# Patient Record
Sex: Male | Born: 1970
Health system: Southern US, Community
[De-identification: ages and names within clinical notes are randomized; demographics above are authoritative.]

## PROBLEM LIST (undated history)

## (undated) DIAGNOSIS — N200 Calculus of kidney: Secondary | ICD-10-CM

## (undated) DIAGNOSIS — G8929 Other chronic pain: Secondary | ICD-10-CM

## (undated) HISTORY — PX: APPENDECTOMY: SHX54

---

## 2005-07-30 ENCOUNTER — Ambulatory Visit: Payer: Self-pay | Admitting: General Surgery

## 2006-10-27 ENCOUNTER — Ambulatory Visit: Payer: Self-pay | Admitting: Family Medicine

## 2007-02-23 ENCOUNTER — Emergency Department: Payer: Self-pay | Admitting: Emergency Medicine

## 2007-03-02 ENCOUNTER — Ambulatory Visit: Payer: Self-pay | Admitting: Urology

## 2008-04-15 ENCOUNTER — Ambulatory Visit: Payer: Self-pay | Admitting: Urology

## 2008-05-21 ENCOUNTER — Ambulatory Visit: Payer: Self-pay | Admitting: Urology

## 2008-10-14 ENCOUNTER — Ambulatory Visit: Payer: Self-pay | Admitting: Urology

## 2008-11-08 ENCOUNTER — Ambulatory Visit: Payer: Self-pay | Admitting: Urology

## 2009-01-08 ENCOUNTER — Ambulatory Visit: Payer: Self-pay | Admitting: Urology

## 2009-01-23 ENCOUNTER — Ambulatory Visit: Payer: Self-pay | Admitting: Urology

## 2011-02-11 ENCOUNTER — Ambulatory Visit: Payer: Self-pay | Admitting: Urology

## 2013-01-14 ENCOUNTER — Emergency Department: Payer: Self-pay | Admitting: Emergency Medicine

## 2013-01-14 LAB — CBC
RBC: 4.95 10*6/uL (ref 4.40–5.90)
RDW: 12.7 % (ref 11.5–14.5)
WBC: 10.2 10*3/uL (ref 3.8–10.6)

## 2013-01-14 LAB — URINALYSIS, COMPLETE
Bacteria: NONE SEEN
Bilirubin,UR: NEGATIVE
Glucose,UR: NEGATIVE mg/dL (ref 0–75)
Ketone: NEGATIVE
Nitrite: NEGATIVE
Ph: 8 (ref 4.5–8.0)
RBC,UR: 9 /HPF (ref 0–5)
Squamous Epithelial: <1

## 2013-01-14 LAB — BASIC METABOLIC PANEL
Anion Gap: 9 (ref 7–16)
Osmolality: 276 (ref 275–301)

## 2013-03-10 ENCOUNTER — Ambulatory Visit: Payer: Self-pay | Admitting: Physical Medicine and Rehabilitation

## 2013-08-07 ENCOUNTER — Ambulatory Visit: Payer: Self-pay | Admitting: Family Medicine

## 2013-08-07 LAB — RAPID INFLUENZA A&B ANTIGENS

## 2013-10-11 DIAGNOSIS — N200 Calculus of kidney: Secondary | ICD-10-CM

## 2013-10-11 HISTORY — DX: Calculus of kidney: N20.0

## 2013-10-11 HISTORY — PX: BACK SURGERY: SHX140

## 2013-12-27 ENCOUNTER — Ambulatory Visit: Payer: Self-pay | Admitting: Physical Medicine and Rehabilitation

## 2014-12-21 ENCOUNTER — Ambulatory Visit: Payer: Self-pay | Admitting: Neurosurgery

## 2015-02-15 ENCOUNTER — Emergency Department: Payer: Managed Care, Other (non HMO)

## 2015-02-15 ENCOUNTER — Encounter: Payer: Self-pay | Admitting: Emergency Medicine

## 2015-02-15 ENCOUNTER — Emergency Department
Admission: EM | Admit: 2015-02-15 | Discharge: 2015-02-15 | Disposition: A | Discharge status: Home / Self Care | Payer: Managed Care, Other (non HMO) | Attending: Emergency Medicine | Admitting: Emergency Medicine

## 2015-02-15 DIAGNOSIS — R109 Unspecified abdominal pain: Secondary | ICD-10-CM | POA: Diagnosis present

## 2015-02-15 DIAGNOSIS — N23 Unspecified renal colic: Secondary | ICD-10-CM | POA: Insufficient documentation

## 2015-02-15 HISTORY — DX: Calculus of kidney: N20.0

## 2015-02-15 LAB — URINALYSIS COMPLETE WITH MICROSCOPIC (ARMC ONLY)
Bilirubin Urine: NEGATIVE
Glucose, UA: NEGATIVE mg/dL
Ketones, ur: NEGATIVE mg/dL
LEUKOCYTES UA: NEGATIVE
Nitrite: NEGATIVE
PH: 7 (ref 5.0–8.0)
PROTEIN: NEGATIVE mg/dL
Specific Gravity, Urine: 1.02 (ref 1.005–1.030)
Squamous Epithelial / LPF: NONE SEEN

## 2015-02-15 LAB — CBC
HCT: 46 % (ref 40.0–52.0)
Hemoglobin: 15.8 g/dL (ref 13.0–18.0)
MCH: 29.6 pg (ref 26.0–34.0)
MCHC: 34.4 g/dL (ref 32.0–36.0)
MCV: 86 fL (ref 80.0–100.0)
Platelets: 336 10*3/uL (ref 150–440)
RBC: 5.34 MIL/uL (ref 4.40–5.90)
RDW: 12.6 % (ref 11.5–14.5)
WBC: 9.1 10*3/uL (ref 3.8–10.6)

## 2015-02-15 LAB — BASIC METABOLIC PANEL
Anion gap: 11 (ref 5–15)
BUN: 13 mg/dL (ref 6–20)
CO2: 24 mmol/L (ref 22–32)
Calcium: 9.6 mg/dL (ref 8.9–10.3)
Chloride: 102 mmol/L (ref 101–111)
Creatinine, Ser: 1.11 mg/dL (ref 0.61–1.24)
Glucose, Bld: 109 mg/dL — ABNORMAL HIGH (ref 65–99)
POTASSIUM: 3.3 mmol/L — AB (ref 3.5–5.1)
SODIUM: 137 mmol/L (ref 135–145)

## 2015-02-15 MED ORDER — TAMSULOSIN HCL 0.4 MG PO CAPS: 0.4000 mg | ORAL_CAPSULE | Freq: Every day | ORAL | Status: DC

## 2015-02-15 MED ORDER — OXYCODONE-ACETAMINOPHEN 5-325 MG PO TABS
ORAL_TABLET | ORAL | Status: AC
  Administered 2015-02-15: 1 via ORAL
  Filled 2015-02-15: qty 1

## 2015-02-15 MED ORDER — OXYCODONE-ACETAMINOPHEN 5-325 MG PO TABS
1.0000 | ORAL_TABLET | Freq: Once | ORAL | Status: AC
  Administered 2015-02-15: 1 via ORAL

## 2015-02-15 NOTE — Discharge Instructions (Signed)
Abdominal Pain Many things can cause abdominal pain. Usually, abdominal pain is not caused by a disease and will improve without treatment. It can often be observed and treated at home. Your health care provider will do a physical exam and possibly order blood tests and X-rays to help determine the seriousness of your pain. However, in many cases, more time must pass before a clear cause of the pain can be found. Before that point, your health care provider may not know if you need more testing or further treatment. HOME CARE INSTRUCTIONS  Monitor your abdominal pain for any changes. The following actions may help to alleviate any discomfort you are experiencing:  Only take over-the-counter or prescription medicines as directed by your health care provider.  Do not take laxatives unless directed to do so by your health care provider.  Try a clear liquid diet (broth, tea, or water) as directed by your health care provider. Slowly move to a bland diet as tolerated. SEEK MEDICAL CARE IF:  You have unexplained abdominal pain.  You have abdominal pain associated with nausea or diarrhea.  You have pain when you urinate or have a bowel movement.  You experience abdominal pain that wakes you in the night.  You have abdominal pain that is worsened or improved by eating food.  You have abdominal pain that is worsened with eating fatty foods.  You have a fever. SEEK IMMEDIATE MEDICAL CARE IF:   Your pain does not go away within 2 hours.  You keep throwing up (vomiting).  Your pain is felt only in portions of the abdomen, such as the right side or the left lower portion of the abdomen.  You pass bloody or black tarry stools. MAKE SURE YOU:  Understand these instructions.   Will watch your condition.   Will get help right away if you are not doing well or get worse.  Document Released: 07/07/2005 Document Revised: 10/02/2013 Document Reviewed: 06/06/2013 [facility] Patient Information  2015 [place]. This information is not intended to replace advice given to you by your health care provider. Make sure you discuss any questions you have with your health care provider. PLEASE RETURN FOR WORSE PAIN, FEVER OR VOMITING.  FOLLOW UP WITH YOUR UROLOGIST DR WOLFF. STRAIN THE URINE, TAKE THE STONE TO DR WOLFF IF YOU CATCH IT.  USE THE PERCOCET 1 - 2 PILLS 4 X A DAY IF NEEDED FOR PAIN. DO NOT TAKE THE PERCOCET AND VICODIN TOGETHER. TAKE THE FLOMAX 1 PILL A DAY TO HELP THE STONE PASS.

## 2015-02-15 NOTE — ED Notes (Signed)
Patient presents to the ED with right flank pain that began at 7:15am while urinating.  Patient reports history of kidney stone about 1 year ago.  Patient states this pain feels similar.  Patient denies vomiting, reports nausea and feeling faint earlier.  Patient states pain was originally a 10 but seems to be easing now.

## 2015-02-15 NOTE — ED Provider Notes (Signed)
[facility] Emergency Department Provider Note    ____________________________________________  Time seen: 72830  I have reviewed the triage vital signs and the nursing notes.   HISTORY  Chief Complaint Flank Pain       HPI Jason Park is a 44 y.o. male she reports he has had right flank pain starting today while having a bowel movement A is like pain he had with prior kidney stone also reports slight tingling at the tip of his penis today pain was in the lower right flank came on this morning is gone now he reports he took a hydrocodone just before getting in the car so come on by the time he got here pain was 10 out of 10 now at 0 thinks he did make it better or worse she had no other associated symptoms she reports also that he had surgery in December and the vent over posture he had to a doctor when he developed this flank pain seems to aggravated his back pain slightly back pain is now about a 3 out of 5 which is his normal level of back pain he denies any nausea vomiting fever chills dysuria urgency frequency cough or anything else     Past Medical History  Diagnosis Date  . Kidney stone 2015    There are no active problems to display for this patient.   Past Surgical History  Procedure Laterality Date  . Appendectomy    . Back surgery  2015    No current outpatient prescriptions on file.  Allergies Bee venom and Penicillins  No family history on file.  Social History History  Substance Use Topics  . Smoking status: Never Smoker   . Smokeless tobacco: Never Used  . Alcohol Use: No    Review of Systems  review of systems is negative for 10 systems except for as described in history of present illness  ____________________________________________   PHYSICAL EXAM:  VITAL SIGNS: ED Triage Vitals  Enc Vitals Group     BP 02/15/15 0805 159/97 mmHg     Pulse Rate 02/15/15 0805 93     Resp 02/15/15 0805 22     Temp 02/15/15  0805 97.6 F (36.4 C)     Temp Source 02/15/15 0805 Oral     SpO2 02/15/15 0805 100 %     Weight 02/15/15 0805 200 lb (90.719 kg)     Height 02/15/15 0805 6\' 2"  (1.88 m)     Head Cir --      Peak Flow --      Pain Score 02/15/15 0806 5     Pain Loc --      Pain Edu? --      Excl. in GC? --      Constitutional: Alert and oriented. Well appearing and in no distress. Eyes: Conjunctivae are normal. PERRL. Normal extraocular movements. ENT   Head: Normocephalic and atraumatic.   Nose: No congestion/rhinnorhea.   Mouth/Throat: Mucous membranes are moist.   Neck: No stridor. Hematological/Lymphatic/Immunilogical: No cervical lymphadenopathy. Cardiovascular: Normal rate, regular rhythm. Normal and symmetric distal pulses are present in all extremities. No murmurs, rubs, or gallops. Respiratory: Normal respiratory effort without tachypnea nor retractions. Breath sounds are clear and equal bilaterally. No wheezes/rales/rhonchi. Gastrointestinal: Soft and nontender. No distention. No abdominal bruits. There is no CVA tenderness. Genitourinary: Deferred as patient's daughters are in the room with him Musculoskeletal: Nontender with normal range of motion in all extremities. No joint effusions.  No lower  extremity tenderness nor edema. Neurologic:  Normal speech and language. No gross focal neurologic deficits are appreciated. Speech is normal. No gait instability. Skin:  Skin is warm, dry and intact. No rash noted. Psychiatric: Mood and affect are normal. Speech and behavior are normal. Patient exhibits appropriate insight and judgment.  ____________________________________________    LABS (pertinent positives/negatives)    ____________________________________________   EKG    ____________________________________________    RADIOLOGY    ____________________________________________   PROCEDURES  Procedure(s) performed: None  Critical Care performed:  No  ____________________________________________   INITIAL IMPRESSION / ASSESSMENT AND PLAN / ED COURSE  Pertinent labs & imaging results that were available during my care of the patient were reviewed by me and considered in my medical decision making (see chart for details).  Patient reports pain is slightly worse than earlier but no where near as bad as it was before he came in he has had 2 previous CT scans discussed with him the number to much radiation the fact that really do not want a CAT scan and began slight tenderness in the right lower quadrant however he has had an appendectomy is not nauseated and vomit or vomiting he does not have a fever his white count is normal I do not really feel a pressing need to CT him at this time he is aware of the need to return for worse pain fever vomiting or any other symptoms I will give him his Flomax and Percocet which is slightly stronger than the hydrocodone that he is taking and he will follow with Dr. Artis FlockWolfe urologist  ____________________________________________   FINAL CLINICAL IMPRESSION(S) / ED DIAGNOSES  Final diagnoses:  Abdominal pain, unspecified abdominal location  Renal colic    Arnaldo NatalPaul F Malinda, MD 02/15/15 1133

## 2015-06-30 ENCOUNTER — Encounter: Payer: Self-pay | Admitting: Physical Therapy

## 2015-06-30 ENCOUNTER — Ambulatory Visit: Payer: Managed Care, Other (non HMO) | Attending: Neurosurgery | Admitting: Physical Therapy

## 2015-06-30 DIAGNOSIS — R269 Unspecified abnormalities of gait and mobility: Secondary | ICD-10-CM | POA: Diagnosis present

## 2015-06-30 DIAGNOSIS — R262 Difficulty in walking, not elsewhere classified: Secondary | ICD-10-CM

## 2015-06-30 NOTE — Therapy (Signed)
[name] [address] [place], [place], [address] Phone: [phone]   Fax:  [phone]  Physical Therapy Treatment  Patient Details  Name: Jason Park MRN: [id number] Date of Birth: 01/01/1971 Referring Provider:  Dorothey Baseman, MD  Encounter Date: 06/30/2015      PT End of Session - 07/01/15 5638    Visit Number 1   Number of Visits 17   Date for PT Re-Evaluation 08/26/15   PT Start Time 0530   PT Stop Time 0630   PT Time Calculation (min) 60 min   Activity Tolerance Patient limited by pain   Behavior During Therapy [facility] for tasks assessed/performed      Past Medical History  Diagnosis Date  . Kidney stone 2015  . Kidney stones     Past Surgical History  Procedure Laterality Date  . Appendectomy    . Back surgery  2015    There were no vitals filed for this visit.  Visit Diagnosis:  Difficulty walking  Abnormality of gait      Subjective Assessment - 06/30/15 1737    Subjective Patient is having back and right leg pain in his lateral thigh and calf.          Eval findings: Strength WFL BLE except RLE hip add -3/5 ROM  WFL Trunk flex/SBR/SBL, extension 15% and painful Sensation: RLE L 3 side of thigh and back of calf Pain in right calf 5/10 and R low back 5 x sit to stand: 15.36 sec Occasional BLE knee giving away 1-2 times in the last 2 months R> L 6 MW 1050 feet Therapeutic exericse: Hooklying with TA contraction and marching, hip abd/ER, heel slides x 20 BLE                       PT Education - 07/01/15 0828    Education provided Yes   Education Details HEP    Person(s) Educated Patient   Methods Explanation   Comprehension Verbalized understanding             PT Long Term Goals - 07/01/15 0837    PT LONG TERM GOAL #1   Title Patient will increase six minute walk test distance to >1800 for progression to community ambulator and improve gait  ability   Status Achieved   PT LONG TERM GOAL #2   Title Patient will reduce timed up and go to <11 seconds to reduce fall risk and demonstrate improved transfer/gait ability.   Status Achieved   PT LONG TERM GOAL #3   Title Patient will be independent in home exercise program to improve strength/mobility for better functional independence with ADLs   Status Achieved   PT LONG TERM GOAL #4   Title Patient will report a worst pain of 3/10 on VAS in             to improve tolerance with ADLs and reduced symptoms with activities   Status Achieved               Plan - 07/01/15 0830    Clinical Impression Statement Patient is 44 year old male with chronic back pain and RLE thigh and calf. He has negative SLR test ,- prone knee flex test, negative spring test lumbar spine and is not tender to palpation paraspinal muscles. He has WNL strength BLE except RLE hip add. He has painful gait with increasing pain to right leg and calf after  200 feet. His 6 MW test is impaired and 5 x sit to stand test is impaired. Trunk ROM is limited in extension to 15 % and painful and  trunk flex, SBright and left is  [facility] and painfree.   Pt will benefit from skilled therapeutic intervention in order to improve on the following deficits Abnormal gait;Decreased range of motion;Difficulty walking;Decreased activity tolerance;Pain;Hypomobility;Impaired sensation   Rehab Potential Fair   PT Frequency 2x / week   PT Duration 8 weeks   PT Treatment/Interventions Therapeutic exercise;Therapeutic activities;Functional mobility training;Gait training;Manual techniques;Ultrasound;Electrical Stimulation;Cryotherapy;Moist Heat;Traction   PT Next Visit Plan Core strengthening   Consulted and Agree with Plan of Care Patient        Problem List There are no active problems to display for this patient.   Ezekiel Ina 07/01/2015, 8:42 AM  [facility] MAIN [facility] SERVICES [address] [place], [place], [address] Phone: [phone]   Fax:  [phone]

## 2015-07-02 ENCOUNTER — Ambulatory Visit: Payer: Managed Care, Other (non HMO) | Admitting: Physical Therapy

## 2015-07-07 ENCOUNTER — Ambulatory Visit: Payer: Managed Care, Other (non HMO) | Admitting: Physical Therapy

## 2015-07-09 ENCOUNTER — Ambulatory Visit: Payer: Managed Care, Other (non HMO) | Admitting: Physical Therapy

## 2015-07-14 ENCOUNTER — Ambulatory Visit: Payer: Managed Care, Other (non HMO) | Attending: Neurosurgery | Admitting: Physical Therapy

## 2015-07-14 DIAGNOSIS — R269 Unspecified abnormalities of gait and mobility: Secondary | ICD-10-CM | POA: Insufficient documentation

## 2015-07-14 DIAGNOSIS — R262 Difficulty in walking, not elsewhere classified: Secondary | ICD-10-CM | POA: Insufficient documentation

## 2015-07-16 ENCOUNTER — Encounter: Payer: Self-pay | Admitting: Physical Therapy

## 2015-07-16 ENCOUNTER — Ambulatory Visit: Payer: Managed Care, Other (non HMO) | Admitting: Physical Therapy

## 2015-07-16 DIAGNOSIS — R269 Unspecified abnormalities of gait and mobility: Secondary | ICD-10-CM | POA: Diagnosis present

## 2015-07-16 DIAGNOSIS — R262 Difficulty in walking, not elsewhere classified: Secondary | ICD-10-CM | POA: Diagnosis present

## 2015-07-16 NOTE — Therapy (Signed)
[place] [facility] MAIN [facility] SERVICES [address] [place], [place], [address] Phone: [phone]   Fax:  [phone]  Physical Therapy Treatment  Patient Details  Name: Jason Park MRN: [id number] Date of Birth: 1971-05-02 Referring Provider:  Shellia Carwin., MD  Encounter Date: 07/16/2015      PT End of Session - 07/16/15 1848    Visit Number 3   Number of Visits 17   Date for PT Re-Evaluation 08/26/15   PT Start Time 0545   PT Stop Time 0655   PT Time Calculation (min) 70 min   Activity Tolerance Patient limited by pain   Behavior During Therapy [facility] for tasks assessed/performed      Past Medical History  Diagnosis Date  . Kidney stone 2015  . Kidney stones     Past Surgical History  Procedure Laterality Date  . Appendectomy    . Back surgery  2015    There were no vitals filed for this visit.  Visit Diagnosis:  Difficulty walking  Abnormality of gait      Subjective Assessment - 07/16/15 1847    Subjective Patient is having back and right leg pain in his lateral thigh and calf.7/10            Manual therapy long axis distraction  BLE x 6 bouts x 3 minutes, family instructed in long axis distraction with wife and daughter Instructed in body mechanics and safe mobility and seating positions.  Lumbar  traction 80  Lbs 10 sec, 120 lbs  20 sec   Prone x 10 minutes and 10 minutes supine positionPatient has 0/10 pain in legs after treatment and 7/10 pain in back.                      PT Education - 07/16/15 1847    Education provided Yes   Education Details HEP for family   Person(s) Educated Patient   Methods Explanation   Comprehension Verbalized understanding             PT Long Term Goals - 07/01/15 0837    PT LONG TERM GOAL #1   Title Patient will increase six minute walk test distance to >1800 for progression to community ambulator and improve gait ability   Status Achieved   PT LONG TERM GOAL #2   Title Patient will reduce timed up and go to <11 seconds to reduce fall risk and demonstrate improved transfer/gait ability.   Status Achieved   PT LONG TERM GOAL #3   Title Patient will be independent in home exercise program to improve strength/mobility for better functional independence with ADLs   Status Achieved   PT LONG TERM GOAL #4   Title Patient will report a worst pain of 3/10 on VAS in             to improve tolerance with ADLs and reduced symptoms with activities   Status Achieved               Problem List There are no active problems to display for this patient.   Ezekiel Ina 07/17/2015, 9:15 AM  [place] MAIN [facility] SERVICES [address] [place], [place], [address] Phone: [phone]   Fax:  [phone]

## 2015-07-21 ENCOUNTER — Encounter: Payer: Self-pay | Admitting: Physical Therapy

## 2015-07-21 ENCOUNTER — Ambulatory Visit: Payer: Managed Care, Other (non HMO) | Admitting: Physical Therapy

## 2015-07-21 DIAGNOSIS — R262 Difficulty in walking, not elsewhere classified: Secondary | ICD-10-CM | POA: Diagnosis not present

## 2015-07-21 DIAGNOSIS — R269 Unspecified abnormalities of gait and mobility: Secondary | ICD-10-CM

## 2015-07-21 NOTE — Therapy (Signed)
[place] [facility] MAIN [facility] SERVICES [address] [place], [place], [address] Phone: [phone]   Fax:  [phone]  Physical Therapy Treatment  Patient Details  Name: Jason Park MRN: [id number] Date of Birth: [birth year] Referring Provider:  Shellia Carwin., MD  Encounter Date: 07/21/2015      PT End of Session - 07/21/15 1758    Visit Number 4   Number of Visits 17   Date for PT Re-Evaluation 08/26/15   PT Start Time 0510   PT Stop Time 0550   PT Time Calculation (min) 40 min   Activity Tolerance Patient limited by pain   Behavior During Therapy [facility] for tasks assessed/performed      Past Medical History  Diagnosis Date  . Kidney stone 2015  . Kidney stones     Past Surgical History  Procedure Laterality Date  . Appendectomy    . Back surgery  2015    There were no vitals filed for this visit.  Visit Diagnosis:  Difficulty walking  Abnormality of gait      Subjective Assessment - 07/21/15 1736    Subjective Patietn is having 3/10 back pain and right foot pain and tingling.   Currently in Pain? Yes   Pain Score 3    Pain Location Foot        Long axis distraction LLE and RLE x 3 bouts 8 minutes each PA glides grade 2 L1-L5 x 30 Prone extension exercises and standing extension exercises with reduction in symptons to 0/10 R foot and then increased to 2/10 after 1000 feet of ambulation Instructed in body mechanics and posture.                          PT Education - 07/21/15 1757    Education provided Yes   Education Details HEP    Person(s) Educated Patient   Methods Explanation   Comprehension Verbalized understanding             PT Long Term Goals - 07/01/15 0837    PT LONG TERM GOAL #1   Title Patient will increase six minute walk test distance to >1800 for progression to community ambulator and improve gait ability   Status Achieved   PT LONG TERM GOAL #2   Title  Patient will reduce timed up and go to <11 seconds to reduce fall risk and demonstrate improved transfer/gait ability.   Status Achieved   PT LONG TERM GOAL #3   Title Patient will be independent in home exercise program to improve strength/mobility for better functional independence with ADLs   Status Achieved   PT LONG TERM GOAL #4   Title Patient will report a worst pain of 3/10 on VAS in             to improve tolerance with ADLs and reduced symptoms with activities   Status Achieved               Plan - 07/21/15 1758    Clinical Impression Statement Patient is having decreased symptoms after manual therapy and prone extension exercises.    Pt will benefit from skilled therapeutic intervention in order to improve on the following deficits Abnormal gait;Decreased range of motion;Difficulty walking;Decreased activity tolerance;Pain;Hypomobility;Impaired sensation   Rehab Potential Fair   PT Frequency 2x / week   PT Duration 8 weeks   PT Treatment/Interventions Therapeutic exercise;Therapeutic activities;Functional mobility training;Gait training;Manual techniques;Ultrasound;Electrical Stimulation;Cryotherapy;Moist Heat;Traction  PT Next Visit Plan Core strengthening   Consulted and Agree with Plan of Care Patient        Problem List There are no active problems to display for this patient.   Ezekiel Ina 07/21/2015, 6:00 PM  [place] [facility] MAIN [facility] SERVICES [address] [place], [place], [address] Phone: [phone]   Fax:  [phone]

## 2015-07-23 ENCOUNTER — Ambulatory Visit: Payer: Managed Care, Other (non HMO) | Admitting: Physical Therapy

## 2015-07-28 ENCOUNTER — Encounter: Payer: Managed Care, Other (non HMO) | Admitting: Physical Therapy

## 2015-07-29 ENCOUNTER — Encounter: Payer: Managed Care, Other (non HMO) | Admitting: Physical Therapy

## 2015-07-30 ENCOUNTER — Encounter: Payer: Managed Care, Other (non HMO) | Admitting: Physical Therapy

## 2015-08-05 ENCOUNTER — Encounter: Payer: Managed Care, Other (non HMO) | Admitting: Physical Therapy

## 2015-08-11 ENCOUNTER — Encounter: Payer: Managed Care, Other (non HMO) | Admitting: Physical Therapy

## 2015-08-13 ENCOUNTER — Encounter: Payer: Managed Care, Other (non HMO) | Admitting: Physical Therapy

## 2015-08-18 ENCOUNTER — Encounter: Payer: Managed Care, Other (non HMO) | Admitting: Physical Therapy

## 2015-08-20 ENCOUNTER — Encounter: Payer: Managed Care, Other (non HMO) | Admitting: Physical Therapy

## 2016-06-17 ENCOUNTER — Other Ambulatory Visit: Payer: Self-pay | Admitting: Physical Medicine & Rehabilitation

## 2016-06-17 DIAGNOSIS — M961 Postlaminectomy syndrome, not elsewhere classified: Secondary | ICD-10-CM

## 2016-06-29 ENCOUNTER — Ambulatory Visit
Admission: RE | Admit: 2016-06-29 | Discharge: 2016-06-29 | Disposition: A | Discharge status: Home / Self Care | Payer: Managed Care, Other (non HMO) | Source: Ambulatory Visit | Attending: Physical Medicine & Rehabilitation | Admitting: Physical Medicine & Rehabilitation

## 2016-06-29 DIAGNOSIS — M961 Postlaminectomy syndrome, not elsewhere classified: Secondary | ICD-10-CM | POA: Insufficient documentation

## 2016-06-29 DIAGNOSIS — M5416 Radiculopathy, lumbar region: Secondary | ICD-10-CM | POA: Diagnosis not present

## 2016-06-29 DIAGNOSIS — M4806 Spinal stenosis, lumbar region: Secondary | ICD-10-CM | POA: Insufficient documentation

## 2016-10-06 ENCOUNTER — Other Ambulatory Visit: Payer: Self-pay | Admitting: Adult Health

## 2016-10-06 DIAGNOSIS — N5089 Other specified disorders of the male genital organs: Secondary | ICD-10-CM

## 2016-10-07 ENCOUNTER — Ambulatory Visit
Admission: RE | Admit: 2016-10-07 | Discharge: 2016-10-07 | Disposition: A | Discharge status: Home / Self Care | Payer: Managed Care, Other (non HMO) | Source: Ambulatory Visit | Attending: Adult Health | Admitting: Adult Health

## 2016-10-07 DIAGNOSIS — N433 Hydrocele, unspecified: Secondary | ICD-10-CM | POA: Diagnosis not present

## 2016-10-07 DIAGNOSIS — N503 Cyst of epididymis: Secondary | ICD-10-CM | POA: Insufficient documentation

## 2016-10-07 DIAGNOSIS — N5089 Other specified disorders of the male genital organs: Secondary | ICD-10-CM | POA: Insufficient documentation

## 2016-10-07 DIAGNOSIS — I861 Scrotal varices: Secondary | ICD-10-CM | POA: Diagnosis not present

## 2017-06-17 IMAGING — CT CT L SPINE W/O CM
3 of 11 series · 9 of 33 positions shown, 11 images · non-contrast
Comparison: Lumbar MRI 12/21/2014 and earlier.

CLINICAL DATA: 44-year-old male with right lower extremity
radiculopathy. Lumbar back pain. Prior lumbar surgery x2. Subsequent
encounter.

EXAM:
CT LUMBAR SPINE WITHOUT CONTRAST
TECHNIQUE: Multidetector CT imaging of the lumbar spine was performed without
intravenous contrast administration. Multiplanar CT image
reconstructions were also generated.

[Series 7: coronal bone · coronal · 0.29mm/px · 1 of 61 slices shown]
[im 31/61  bone]
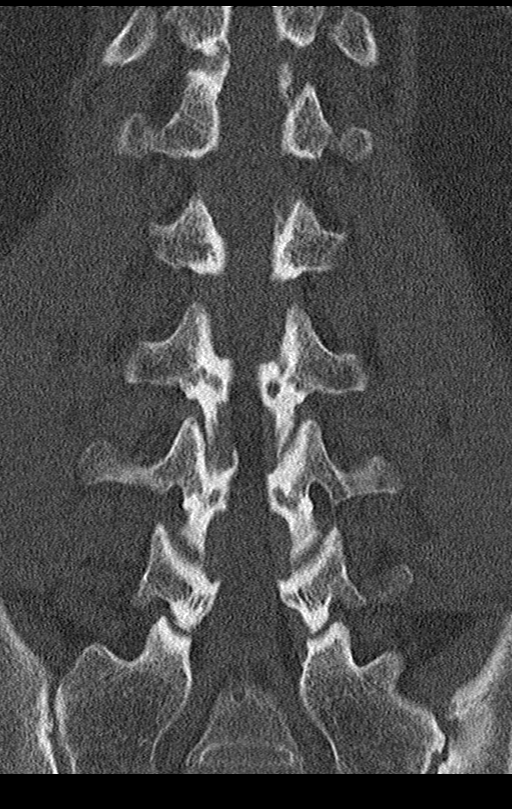

[Series 9: spine · axial · 0.21mm/px · z∈[-257,-135]mm · 3 of 133 slices shown, 4 images]
[im 34/133  soft-tissue]
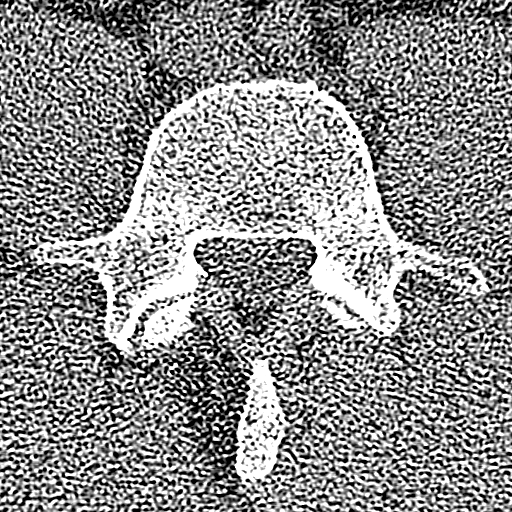
[im 34/133  bone]
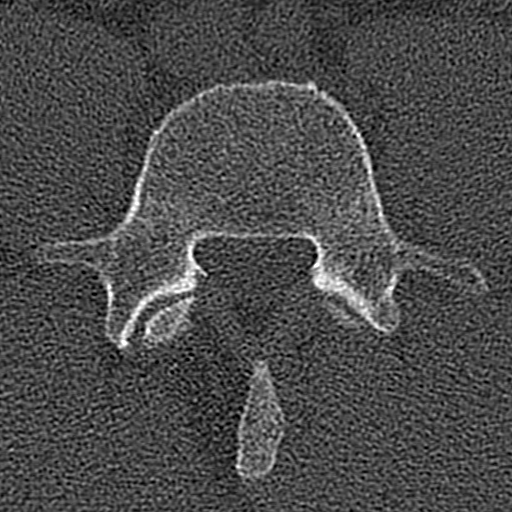
[im 67/133  bone]
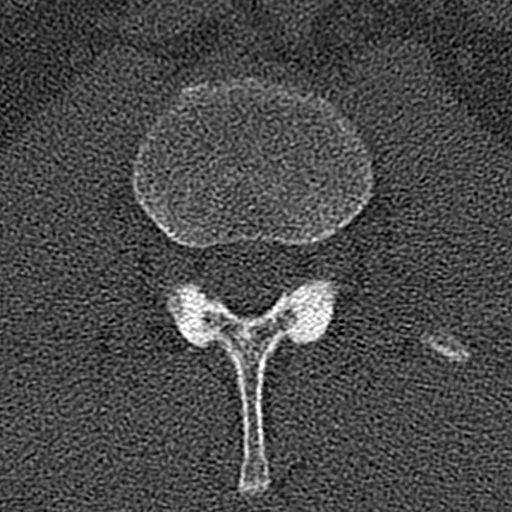
[im 100/133  bone]
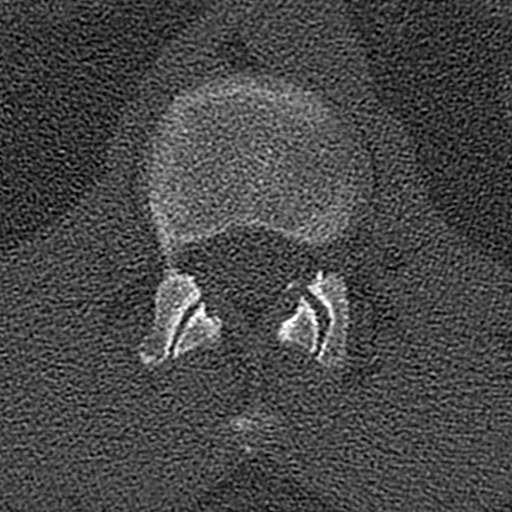

[Series 603: sag · sagittal · 0.43mm/px · 5 of 39 slices shown, 6 images]
[im 13/39  bone]
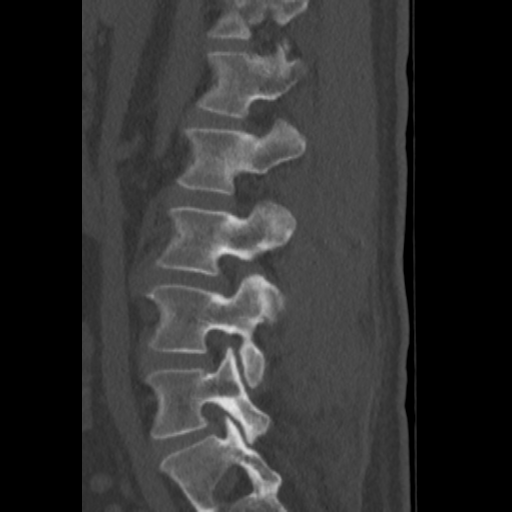
[im 16/39  bone]
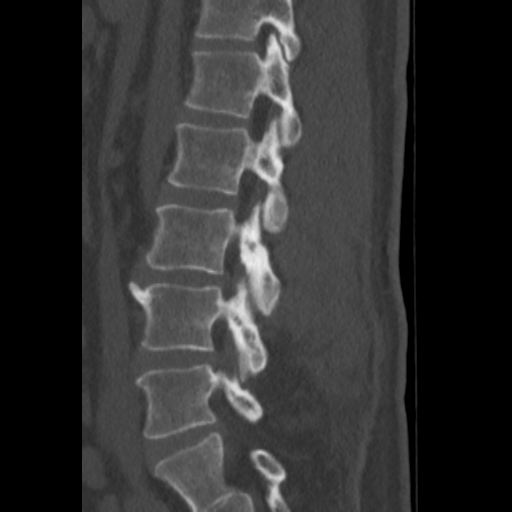
[im 20/39  soft-tissue]
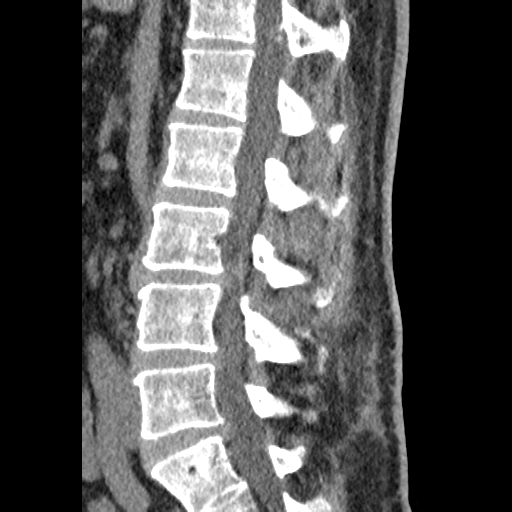
[im 20/39  bone]
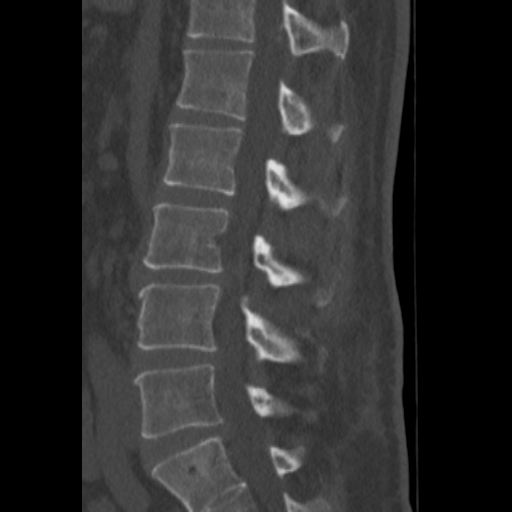
[im 23/39  bone]
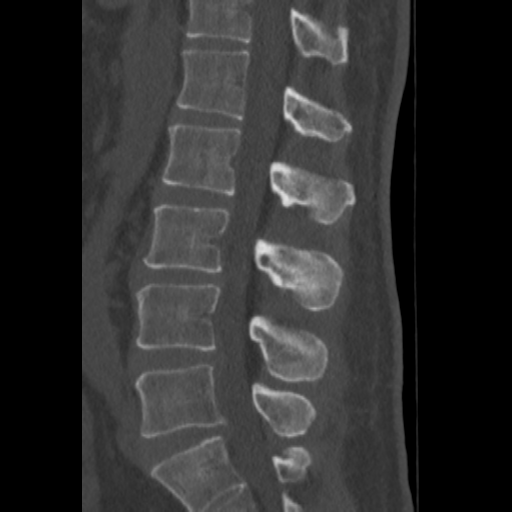
[im 26/39  bone]
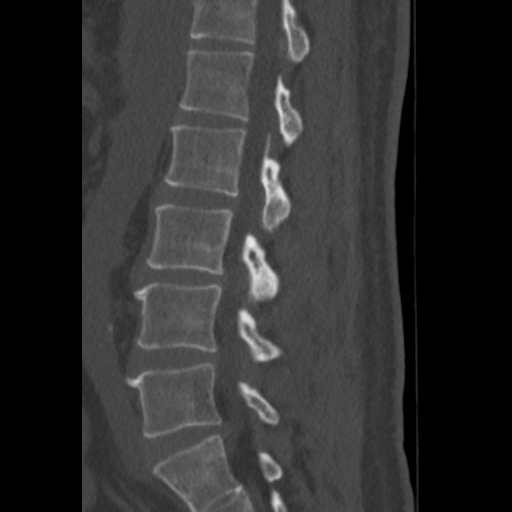

[9 of 33 positions shown; findings below may reference images not displayed]

FINDINGS: Segmentation: The same numbering system is used as on the 3190 MRI,
designating normal lumbar segmentation with vestigial S1-S2 disc
space. By this numbering system there are full size ribs at T12.
There are unfused transverse process ossification centers at L1.

Alignment: Straightening of lumbar lordosis has not significantly
changed.

Vertebrae: No acute osseous abnormality identified. Visible SI
joints and sacrum intact. Small partially visible but benign
appearing sclerotic focus in the medial right iliac bone (series 2,
image 112).

Paraspinal and other soft tissues: Right side generator device and
spinal electrode coursing toward the thoracic spine, superficial to
the lumbar erector spinae muscles. Atrophy of the L5-S1 right
erector spinae muscle with underlying postoperative changes further
detailed below. Negative visualized abdominal viscera.

Disc levels:

T12-L1: Mild to moderate facet hypertrophy. Mild right T12 foraminal
stenosis (series 6, image 25). This level appears stable.

L1-L2:  Negative aside from mild left facet hypertrophy.

L2-L3:  Mild disc bulge.  No stenosis.

L3-L4: Mild circumferential disc bulge. Mild facet hypertrophy.
Borderline to mild spinal stenosis. Mild left L3 foraminal stenosis.

L4-L5: Circumferential disc bulge. Mild facet and ligament flavum
hypertrophy. Superimposed posterior fairly broad-based disc
protrusion (series 3, image 78). Mild spinal stenosis. Mild left
greater than right L4 foraminal stenosis. This level appears stable.
This level appears stable.

L5-S1: Previous right laminectomy. Mild disc bulge and endplate
spurring spurring. Superimposed broad-based right paracentral disc
protrusion. No spinal stenosis. Mild if any right lateral recess
stenosis. Mild bilateral L5 foraminal stenosis. This level appears
stable.
IMPRESSION: 1.  No acute osseous abnormality in the lumbar spine.
2. Stable appearance of lumbar spine degeneration and right side
L5-S1 postoperative changes since the 3190 MRI.
3. Up to mild spinal stenosis at L3-L4 and L4-L5. Up to mild right
lateral recess stenosis at L5-S1. Mild neural foraminal stenosis at
the right T12, left L3, left L4 and bilateral L5 nerve levels.

## 2018-05-14 IMAGING — US US SCROTUM
1 series · 14 of 25 positions shown · non-contrast
Comparison: None.

CLINICAL DATA: Chronic left testicular swelling and nodule. Initial
encounter.

EXAM:
SCROTAL ULTRASOUND
DOPPLER ULTRASOUND OF THE TESTICLES
TECHNIQUE: Complete ultrasound examination of the testicles, epididymis, and
other scrotal structures was performed. Color and spectral Doppler
ultrasound were also utilized to evaluate blood flow to the
testicles.

[Series 1: us scrotum · 0.08mm/px · 14 of 65 slices shown]
[im 1/65]
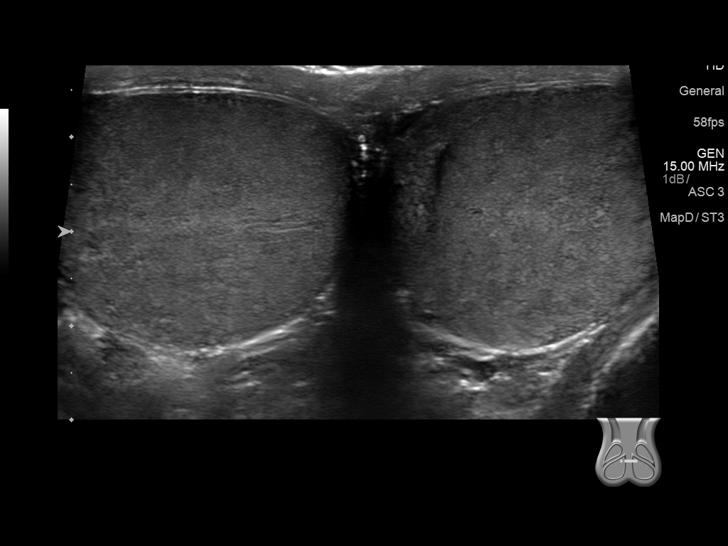
[im 6/65]
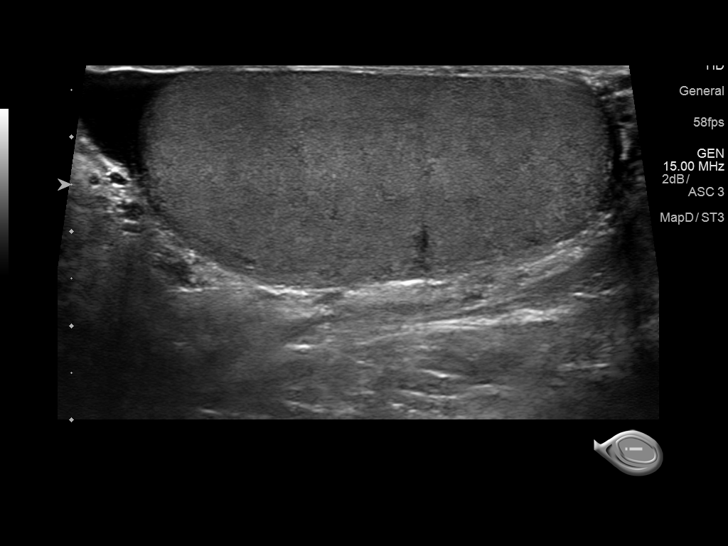
[im 11/65]
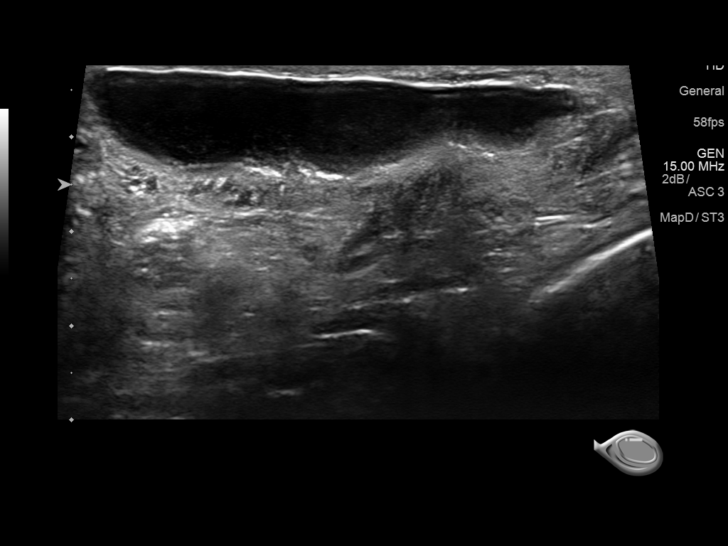
[im 17/65]
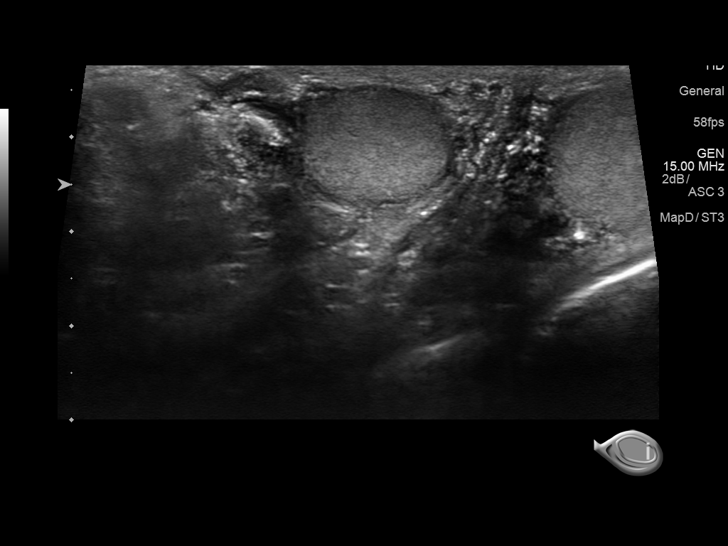
[im 22/65]
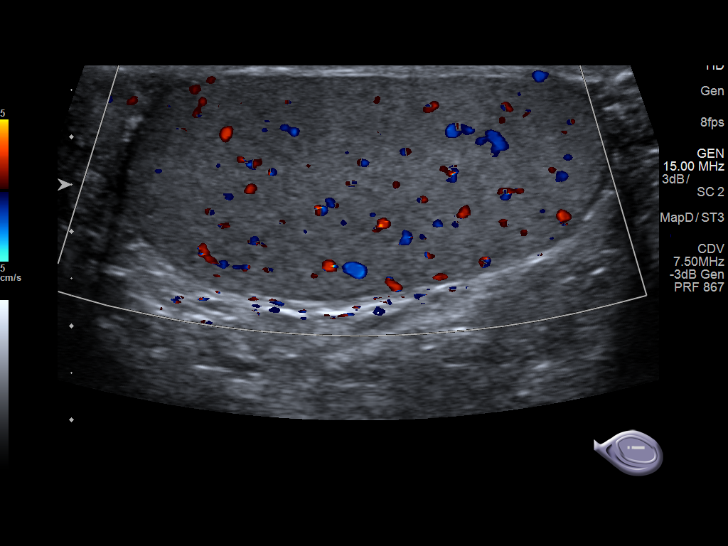
[im 25/65]
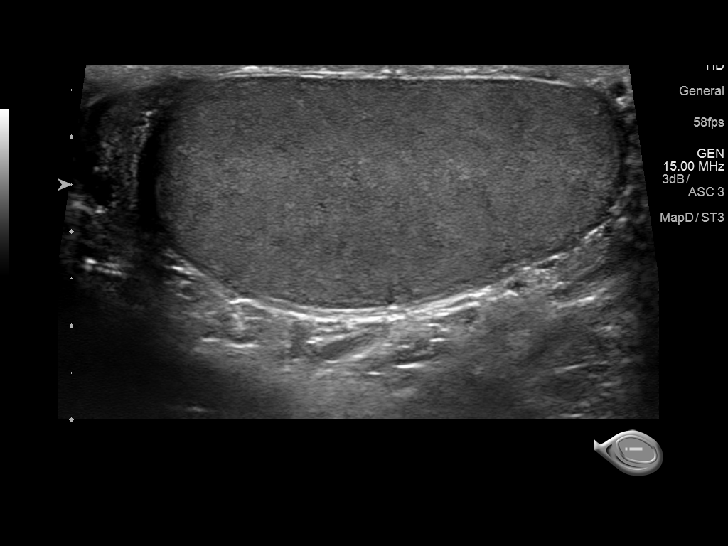
[im 30/65]
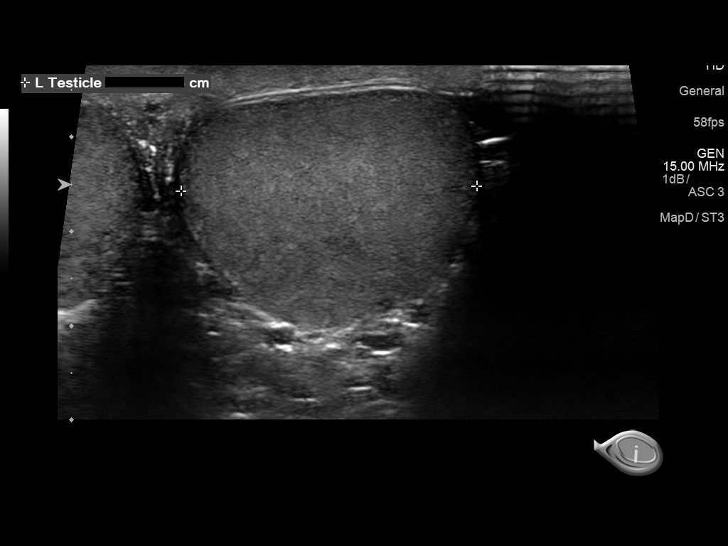
[im 35/65]
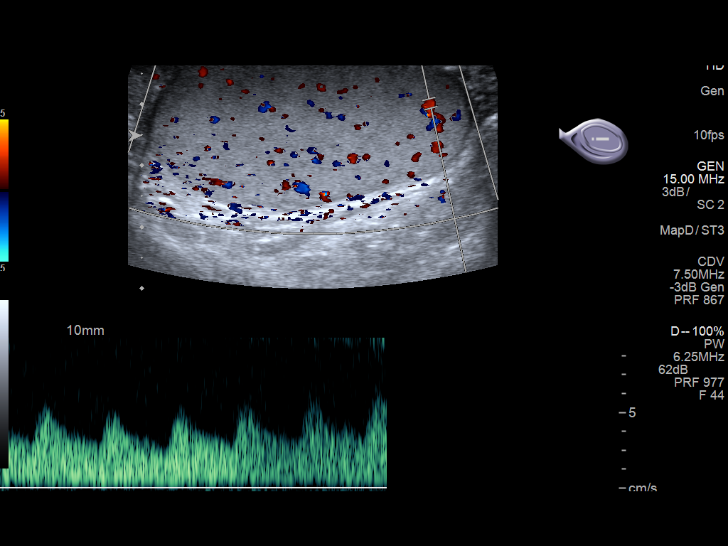
[im 41/65]
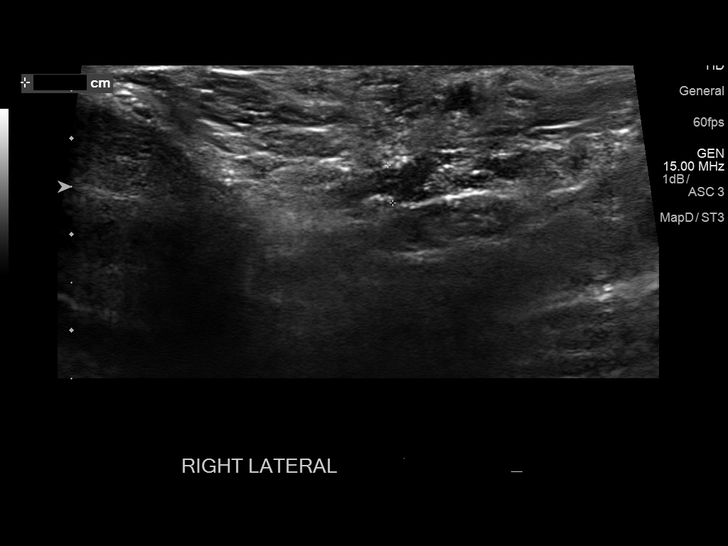
[im 43/65]
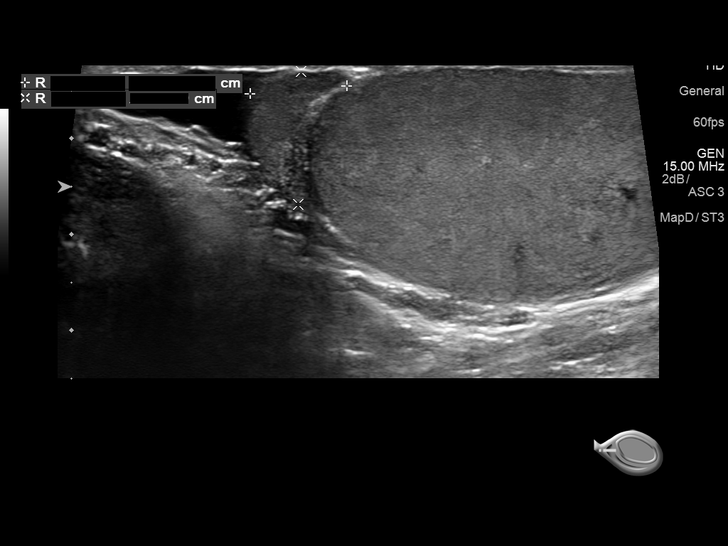
[im 49/65]
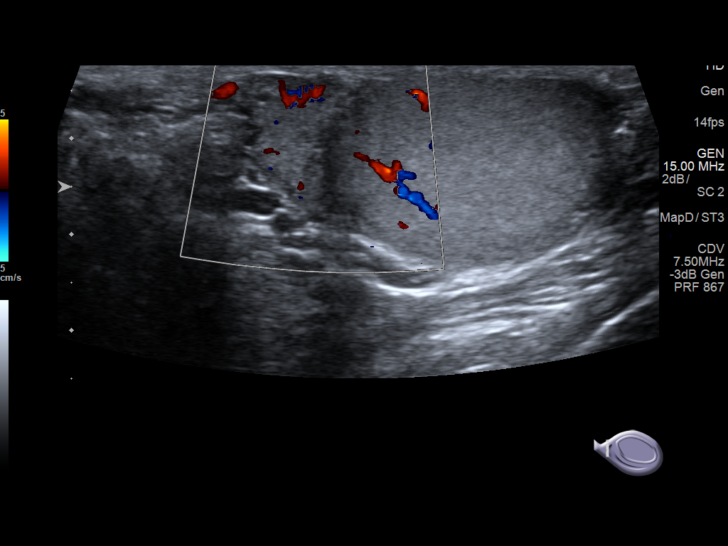
[im 54/65]
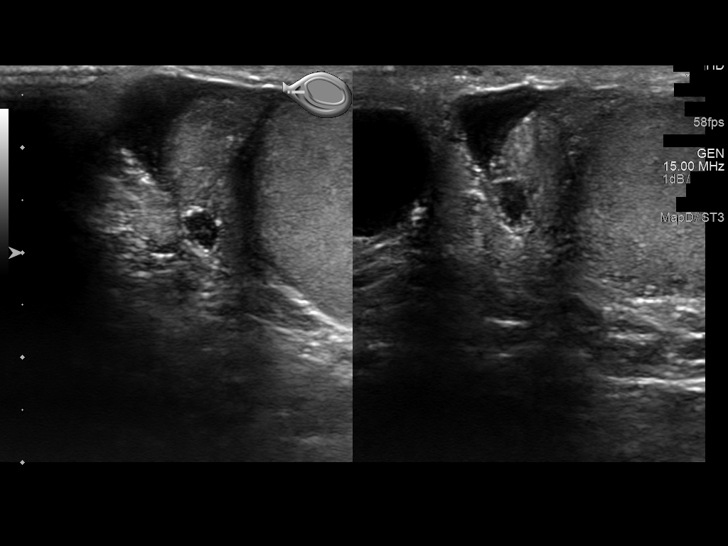
[im 59/65]
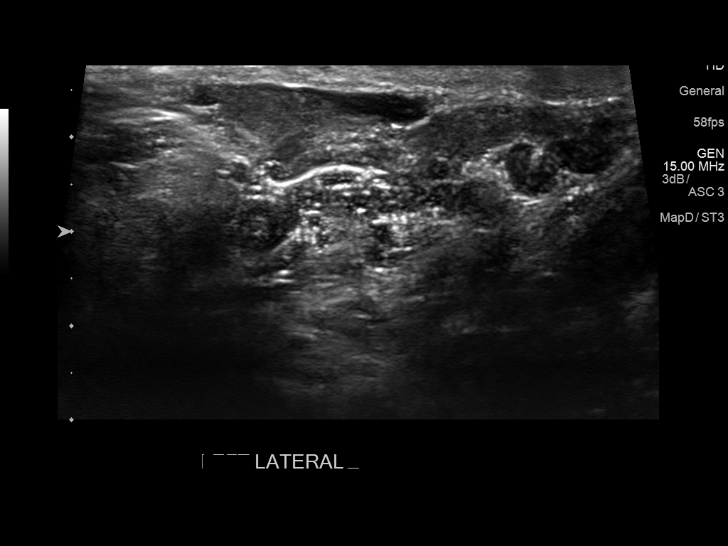
[im 65/65]
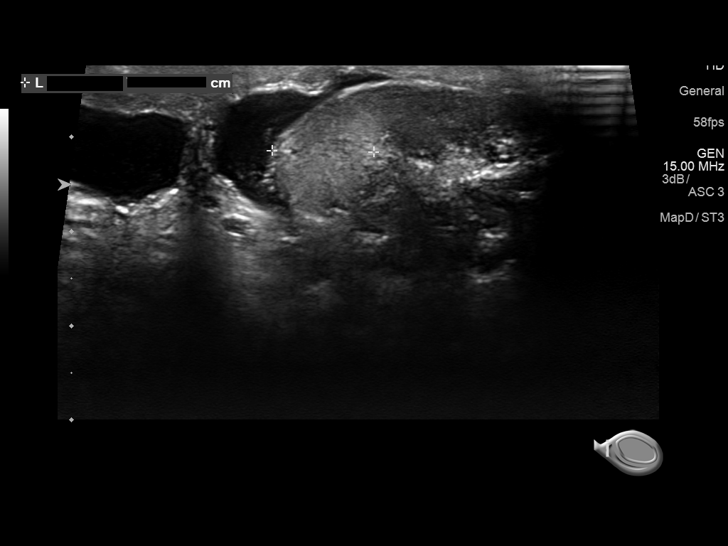

[14 of 25 positions shown; findings below may reference images not displayed]

FINDINGS: Right testicle

Measurements: 5.1 x 2.5 x 3.4 cm. No mass or microlithiasis
visualized.

Left testicle

Measurements: 5.0 x 2.4 x 3.1 cm. No mass or microlithiasis
visualized.

Right epididymis: A 0.3 cm cyst is noted at the right epididymal
head.

Left epididymis: A 0.4 cm cyst is noted at the left epididymal head.

Hydrocele:  Trace bilateral hydroceles are noted.

Varicocele:  Small bilateral patent varicoceles are seen.

Pulsed Doppler interrogation of both testes demonstrates normal low
resistance arterial and venous waveforms bilaterally.
IMPRESSION: 1. No evidence of testicular torsion.
2. Small bilateral patent varicoceles noted.
3. Trace bilateral hydroceles seen.
4. Small bilateral epididymal head cysts noted.

## 2019-07-31 ENCOUNTER — Ambulatory Visit
Admission: EM | Admit: 2019-07-31 | Discharge: 2019-07-31 | Disposition: A | Discharge status: Home / Self Care | Payer: Commercial Managed Care - PPO | Attending: Family Medicine | Admitting: Family Medicine

## 2019-07-31 ENCOUNTER — Other Ambulatory Visit: Payer: Self-pay

## 2019-07-31 ENCOUNTER — Encounter: Payer: Self-pay | Admitting: Emergency Medicine

## 2019-07-31 DIAGNOSIS — B349 Viral infection, unspecified: Secondary | ICD-10-CM | POA: Diagnosis not present

## 2019-07-31 DIAGNOSIS — R519 Headache, unspecified: Secondary | ICD-10-CM

## 2019-07-31 DIAGNOSIS — R197 Diarrhea, unspecified: Secondary | ICD-10-CM | POA: Diagnosis not present

## 2019-07-31 DIAGNOSIS — R0602 Shortness of breath: Secondary | ICD-10-CM

## 2019-07-31 MED ORDER — ONDANSETRON 8 MG PO TBDP: 8.0000 mg | ORAL_TABLET | Freq: Three times a day (TID) | ORAL | 0 refills | Status: DC | PRN

## 2019-07-31 NOTE — ED Provider Notes (Signed)
[facility]    CSN: [id number] Arrival date & time: 07/31/19  8937      History   Chief Complaint Chief Complaint  Patient presents with  . Diarrhea  . Shortness of Breath  . Headache    HPI Jason Park is a 48 y.o. male.   48 yo male with a c/o headache, chillls, shortness of breath, and diarrhea for 3 days. States diarrhea is not watery but it is runny like paste. Has had nausea but denies vomiting, fevers, cough.    Diarrhea Associated symptoms: headaches   Shortness of Breath Associated symptoms: headaches   Headache Associated symptoms: diarrhea     Past Medical History:  Diagnosis Date  . Kidney stone 2015  . Kidney stones     There are no active problems to display for this patient.   Past Surgical History:  Procedure Laterality Date  . APPENDECTOMY    . BACK SURGERY  2015       Home Medications    Prior to Admission medications   Medication Sig Start Date End Date Taking? Authorizing Provider  acetaminophen (TYLENOL) 500 MG tablet Take 500 mg by mouth every 6 (six) hours as needed for moderate pain.   Yes [provider]  buprenorphine (BUTRANS) 10 MCG/HR PTWK patch APPLY 1 PATCH(ES) EVERY 7 DAYS BY TRANSDERMAL ROUTE. 07/13/16  Yes [provider]  cyclobenzaprine (FLEXERIL) 10 MG tablet Take 10 mg by mouth every 8 (eight) hours as needed for muscle spasms.   Yes [provider]  predniSONE (DELTASONE) 5 MG tablet Take 5 mg by mouth daily with breakfast.   Yes [provider]  HYDROcodone-acetaminophen (NORCO) 10-325 MG per tablet Take 1 tablet by mouth every 4 (four) hours as needed for moderate pain.    [provider]  ondansetron (ZOFRAN ODT) 8 MG disintegrating tablet Take 1 tablet (8 mg total) by mouth every 8 (eight) hours as needed. 07/31/19   Payton Mccallum, MD  tamsulosin (FLOMAX) 0.4 MG CAPS capsule Take 1 capsule (0.4 mg total) by mouth daily. 02/15/15   Arnaldo Natal, MD    Family History History reviewed. No pertinent family history.  Social History Social History   Tobacco Use  . Smoking status: Never Smoker  . Smokeless tobacco: Never Used  Substance Use Topics  . Alcohol use: No  . Drug use: No     Allergies   Bee venom and Penicillins   Review of Systems Review of Systems  Respiratory: Positive for shortness of breath.   Gastrointestinal: Positive for diarrhea.  Neurological: Positive for headaches.     Physical Exam Triage Vital Signs ED Triage Vitals  Enc Vitals Group     BP 07/31/19 0846 (!) 120/92     Pulse Rate 07/31/19 0846 86     Resp 07/31/19 0846 18     Temp 07/31/19 0846 98.5 F (36.9 C)     Temp src --      SpO2 07/31/19 0846 99 %     Weight 07/31/19 0843 200 lb (90.7 kg)     Height 07/31/19 0843 6\' 1"  (1.854 m)     Head Circumference --      Peak Flow --      Pain Score 07/31/19 0843 4     Pain Loc --      Pain Edu? --      Excl. in GC? --    No data found.  Updated Vital Signs BP (!) 120/92 (  BP Location: Left Arm)   Pulse 86   Temp 98.5 F (36.9 C)   Resp 18   Ht 6\' 1"  (1.854 m)   Wt 90.7 kg   SpO2 99%   BMI 26.39 kg/m   Visual Acuity Right Eye Distance:   Left Eye Distance:   Bilateral Distance:    Right Eye Near:   Left Eye Near:    Bilateral Near:     Physical Exam Vitals signs and nursing note reviewed.  Constitutional:      General: He is not in acute distress.    Appearance: He is not toxic-appearing or diaphoretic.  Cardiovascular:     Rate and Rhythm: Normal rate and regular rhythm.     Pulses: Normal pulses.     Heart sounds: Normal heart sounds.  Pulmonary:     Effort: Pulmonary effort is normal. No respiratory distress.     Breath sounds: Normal breath sounds. No stridor. No wheezing, rhonchi or rales.  Abdominal:     General: Bowel sounds are normal. There is no distension.     Palpations: Abdomen is soft. There is no mass.     Tenderness: There is no abdominal  tenderness. There is no right CVA tenderness, left CVA tenderness, guarding or rebound.     Hernia: No hernia is present.  Neurological:     Mental Status: He is alert.      UC Treatments / Results  Labs (all labs ordered are listed, but only abnormal results are displayed) Labs Reviewed  NOVEL CORONAVIRUS, NAA (HOSP ORDER, SEND-OUT TO REF LAB; TAT 18-24 HRS)    EKG   Radiology No results found.  Procedures Procedures (including critical care time)  Medications Ordered in UC Medications - No data to display  Initial Impression / Assessment and Plan / UC Course  I have reviewed the triage vital signs and the nursing notes.  Pertinent labs & imaging results that were available during my care of the patient were reviewed by me and considered in my medical decision making (see chart for details).      Final Clinical Impressions(s) / UC Diagnoses   Final diagnoses:  Viral illness    ED Prescriptions    Medication Sig Dispense Auth. Provider   ondansetron (ZOFRAN ODT) 8 MG disintegrating tablet Take 1 tablet (8 mg total) by mouth every 8 (eight) hours as needed. 6 tablet Norval Gable, MD      1. diagnosis reviewed with patient 2. rx as per orders above; reviewed possible side effects, interactions, risks and benefits  3. Recommend supportive treatment with rest, fluids, otc medications prn 4. covid test done 5. Follow-up prn if symptoms worsen or don't improve  PDMP not reviewed this encounter.   Norval Gable, MD 07/31/19 1059

## 2019-07-31 NOTE — ED Triage Notes (Signed)
Patient in office today c/o HA/chills/sob and diarrhea x3d Denies:  Fever [id number]

## 2019-08-01 LAB — NOVEL CORONAVIRUS, NAA (HOSP ORDER, SEND-OUT TO REF LAB; TAT 18-24 HRS): SARS-CoV-2, NAA: NOT DETECTED

## 2019-09-01 ENCOUNTER — Encounter: Payer: Self-pay | Admitting: Emergency Medicine

## 2019-09-01 ENCOUNTER — Other Ambulatory Visit: Payer: Self-pay

## 2019-09-01 ENCOUNTER — Ambulatory Visit
Admission: EM | Admit: 2019-09-01 | Discharge: 2019-09-01 | Disposition: A | Discharge status: Home / Self Care | Payer: Commercial Managed Care - PPO | Attending: Family Medicine | Admitting: Family Medicine

## 2019-09-01 DIAGNOSIS — U071 COVID-19: Secondary | ICD-10-CM

## 2019-09-01 DIAGNOSIS — R509 Fever, unspecified: Secondary | ICD-10-CM | POA: Diagnosis not present

## 2019-09-01 DIAGNOSIS — R519 Headache, unspecified: Secondary | ICD-10-CM | POA: Diagnosis not present

## 2019-09-01 DIAGNOSIS — R05 Cough: Secondary | ICD-10-CM | POA: Diagnosis not present

## 2019-09-01 DIAGNOSIS — M791 Myalgia, unspecified site: Secondary | ICD-10-CM | POA: Diagnosis not present

## 2019-09-01 HISTORY — DX: Other chronic pain: G89.29

## 2019-09-01 LAB — SARS CORONAVIRUS 2 AG (30 MIN TAT): SARS Coronavirus 2 Ag: POSITIVE — AB

## 2019-09-01 LAB — RAPID INFLUENZA A&B ANTIGENS: Influenza B (ARMC): NEGATIVE

## 2019-09-01 LAB — RAPID INFLUENZA A&B ANTIGENS (ARMC ONLY): Influenza A (ARMC): NEGATIVE

## 2019-09-01 MED ORDER — KETOROLAC TROMETHAMINE 10 MG PO TABS: 10.0000 mg | ORAL_TABLET | Freq: Four times a day (QID) | ORAL | 0 refills | Status: AC | PRN

## 2019-09-01 NOTE — Discharge Instructions (Signed)
Rest. Fluids.  Medication as directed for body aches/pain.  If you worsen, go to the ER.  Take care  Dr. Lacinda Axon

## 2019-09-01 NOTE — ED Triage Notes (Addendum)
Patient in today c/o fever (101.8 ax), headache, body aches and cough x 2 days. Patient has tried OTC Tylenol and Robitussin without relief. Patient states several people at work have been covid +, but he is not in direct contact with these employees. Patient's last dose of Tylenol was ~11:00am.

## 2019-09-01 NOTE — ED Provider Notes (Signed)
[facility]    CSN: [id number] Arrival date & time: 09/01/19  1334  History   Chief Complaint Chief Complaint  Patient presents with  . Fever  . Cough  . Generalized Body Aches  . Headache   HPI  48 year old male presents with the above complaints.  Patient reports that he has been sick for the past 2 days.  Patient reports fever, headache, body aches, and associated cough.  He states that there are several people at his work that are Covid positive.  Denies direct contact.  Has been taking over-the-counter Tylenol and Robitussin without resolution.  Patient states that he feels very poorly.  Rates his pain as 7/10 in severity.  No known exacerbating factors.  No other reported symptoms.  No other complaints.  PMH, Surgical Hx, Family Hx, Social History reviewed and updated as below.  Past Medical History:  Diagnosis Date  . Chronic back pain   . Kidney stone 2015  . Kidney stones    Past Surgical History:  Procedure Laterality Date  . APPENDECTOMY    . BACK SURGERY  2015   Home Medications    Prior to Admission medications   Medication Sig Start Date End Date Taking? Authorizing Provider  acetaminophen (TYLENOL) 500 MG tablet Take 500 mg by mouth every 6 (six) hours as needed for moderate pain.   Yes [provider]  Buprenorphine 15 MCG/HR PTWK Apply 1 patch topically once a week. 08/18/19  Yes [provider]  cyclobenzaprine (FLEXERIL) 10 MG tablet Take 10 mg by mouth every 8 (eight) hours as needed for muscle spasms.   Yes [provider]  fluticasone (FLONASE) 50 MCG/ACT nasal spray SPRAY 2 SPRAYS INTO EACH NOSTRIL EVERY DAY 01/08/19  Yes [provider]  pregabalin (LYRICA) 225 MG capsule Take by mouth. 09/13/16  Yes [provider]  ketorolac (TORADOL) 10 MG tablet Take 1 tablet (10 mg total) by mouth every 6 (six) hours as needed for moderate pain or severe pain. 09/01/19   Coral Spikes, DO    Family  History Family History  Problem Relation Age of Onset  . Lung cancer Mother   . Kidney Stones Mother   . Other Father        natural causes    Social History Social History   Tobacco Use  . Smoking status: Never Smoker  . Smokeless tobacco: Never Used  Substance Use Topics  . Alcohol use: No  . Drug use: No     Allergies   Bee venom and Penicillins   Review of Systems Review of Systems  Constitutional: Positive for fever.  Respiratory: Positive for cough.   Musculoskeletal:       Body aches.  Neurological: Positive for headaches.   Physical Exam Triage Vital Signs ED Triage Vitals  Enc Vitals Group     BP 09/01/19 1348 (!) 125/96     Pulse Rate 09/01/19 1348 (!) 104     Resp 09/01/19 1348 18     Temp 09/01/19 1348 99.9 F (37.7 C)     Temp Source 09/01/19 1348 Oral     SpO2 09/01/19 1348 100 %     Weight 09/01/19 1349 200 lb (90.7 kg)     Height 09/01/19 1349 6\' 1"  (1.854 m)     Head Circumference --      Peak Flow --      Pain Score 09/01/19 1348 7     Pain Loc --  Pain Edu? --      Excl. in GC? --    Updated Vital Signs BP (!) 125/96 (BP Location: Left Arm)   Pulse (!) 104   Temp 99.9 F (37.7 C) (Oral)   Resp 18   Ht 6\' 1"  (1.854 m)   Wt 90.7 kg   SpO2 100%   BMI 26.39 kg/m   Visual Acuity Right Eye Distance:   Left Eye Distance:   Bilateral Distance:    Right Eye Near:   Left Eye Near:    Bilateral Near:     Physical Exam Vitals signs and nursing note reviewed.  Constitutional:      Comments: Patient appears ill but is in no acute distress.  HENT:     Head: Normocephalic and atraumatic.  Eyes:     General:        Right eye: No discharge.        Left eye: No discharge.     Conjunctiva/sclera: Conjunctivae normal.  Cardiovascular:     Rate and Rhythm: Regular rhythm. Tachycardia present.  Pulmonary:     Effort: Pulmonary effort is normal.     Breath sounds: Normal breath sounds. No wheezing, rhonchi or rales.  Skin:     General: Skin is warm.     Findings: No rash.  Neurological:     Mental Status: He is alert.  Psychiatric:        Mood and Affect: Mood normal.        Behavior: Behavior normal.    UC Treatments / Results  Labs (all labs ordered are listed, but only abnormal results are displayed) Labs Reviewed  SARS CORONAVIRUS 2 AG (30 MIN TAT) - Abnormal; Notable for the following components:      Result Value   SARS Coronavirus 2 Ag POSITIVE (*)    All other components within normal limits  RAPID INFLUENZA A&B ANTIGENS ([facility] ONLY)    EKG   Radiology No results found.  Procedures Procedures (including critical care time)  Medications Ordered in UC Medications - No data to display  Initial Impression / Assessment and Plan / UC Course  I have reviewed the triage vital signs and the nursing notes.  Pertinent labs & imaging results that were available during my care of the patient were reviewed by me and considered in my medical decision making (see chart for details).    48 year old male presents with the above symptoms.  Rapid Covid test positive today.  Advised to stay home.  Quarantine.  Rest, fluids.  Toradol as needed for pain/body aches.  Supportive care.  If worsens will need to go to the hospital.  Final Clinical Impressions(s) / UC Diagnoses   Final diagnoses:  COVID-19     Discharge Instructions     Rest. Fluids.  Medication as directed for body aches/pain.  If you worsen, go to the ER.  Take care  Dr. 49    ED Prescriptions    Medication Sig Dispense Auth. Provider   ketorolac (TORADOL) 10 MG tablet Take 1 tablet (10 mg total) by mouth every 6 (six) hours as needed for moderate pain or severe pain. 20 tablet Adriana Simas, DO     PDMP not reviewed this encounter.   Tommie Sams, Tommie Sams 09/01/19 1641
